# Patient Record
Sex: Female | Born: 1990 | Race: White | Hispanic: No | Marital: Single | Smoking: Never smoker
Health system: Southern US, Community
[De-identification: ages and names within clinical notes are randomized; demographics above are authoritative.]

---

## 2016-08-15 ENCOUNTER — Encounter (INDEPENDENT_AMBULATORY_CARE_PROVIDER_SITE_OTHER): Payer: Self-pay | Admitting: Orthopaedic Surgery

## 2016-08-15 ENCOUNTER — Ambulatory Visit (INDEPENDENT_AMBULATORY_CARE_PROVIDER_SITE_OTHER): Payer: BLUE CROSS/BLUE SHIELD

## 2016-08-15 ENCOUNTER — Ambulatory Visit (INDEPENDENT_AMBULATORY_CARE_PROVIDER_SITE_OTHER): Payer: BLUE CROSS/BLUE SHIELD | Admitting: Orthopaedic Surgery

## 2016-08-15 VITALS — Ht 67.0 in | Wt 120.0 lb

## 2016-08-15 DIAGNOSIS — M545 Low back pain: Secondary | ICD-10-CM

## 2016-08-15 NOTE — Progress Notes (Signed)
Office Visit Note   Patient: Stacey Mills           Date of Birth: 1990/10/04           MRN: [id number] Visit Date: 08/15/2016              Requested by: No referring provider defined for this encounter. PCP: Patient, No Pcp Per   Assessment & Plan: Visit Diagnoses:  1. Low back pain, unspecified back pain laterality, unspecified chronicity, with sciatica presence unspecified     Plan: I can certainly see whether this is frustrating for her given the failure of rest, ice, time, anti-inflammatories, activity modification, chiropractic treatment as well as formal physical therapy. This is not appear to be an exertional compartment syndrome and this may be more lumbar spine related since she says it comes from the waist down. At this point given the chronicity of her situation I would like to obtain an MRI to assess for stenosis or rule out any other issues that can be related to the radicular symptoms. I'll reexamine her to the next visit after the MRI is obtained all questions were encouraged and answered.  Follow-Up Instructions: Return in about 2 weeks (around 08/29/2016).   Orders:  Orders Placed This Encounter  Procedures  . XR Lumbar Spine 2-3 Views  . MR Lumbar Spine w/o contrast   No orders of the defined types were placed in this encounter.     Procedures: No procedures performed   Clinical Data: No additional findings.   Subjective: Chief Complaint  Patient presents with  . Lower Back - Pain  . Right Leg - Pain  Patient is very pleasant athletic and then 26 year old female who is certainly frustrated from over a year's worth of difficult to describe pain and "weird feeling" and involves mainly her right lower extremity. She says things do not feel right from the waist down but this is activity related. Any type of exercise activities such as treadmill running that occur within a short time frame causes pain. She does not describe this toe is a compartment syndrome or  an exertional compartment syndrome type of pain. There is not a specific numbness and tingling. She says it the right foot sometimes slaps against the ground and that her foot in general does not feel right but is hard to describe. It's almost entire type of feeling. This does occur with only high-impact activities. She does get pain in her back was radiate down her legs. She denies any change in bowel or bladder function. She has seen a chiropractor and has multiple encounters with several different physical therapist to no avail. Was become frustrated fact that she cannot exercise the way she would like to.  HPI  Review of Systems She denies any headache, chest pain, short of breath, fever, chills, nausea, vomiting  Objective: Vital Signs: Ht 5\' 7"  (1.702 m)   Wt 120 lb (54.4 kg)   BMI 18.79 kg/m   Physical Exam She is alert and oriented 3 and in no acute distress Ortho Exam She is a thin appearing individual. She has excellent strength in her bilateral lower extremities with normal sensation in all dermatomes. She has normal muscle contours. She has normal perfusion of both feet with palpable pulses. She has full flexion-extension of her lumbar spine but a negative straight leg raise bilaterally. There is no Tinel sign over the superficial peroneal nerve on the right or left side. Specialty Comments:  No specialty comments available.  Imaging: Xr Lumbar Spine 2-3 Views  Result Date: 08/15/2016 An AP and lateral of the lumbar spine show no acute findings. The disc heights and spaces are well maintained. The foramina appear patent. The overall alignment shows normal lordosis on lateral view and a straight on the AP view.    PMFS History: There are no active problems to display for this patient.  No past medical history on file.  No family history on file.  No past surgical history on file. Social History   Occupational History  . Not on file.   Social History Main Topics  .  Smoking status: Never Smoker  . Smokeless tobacco: Never Used  . Alcohol use Yes     Comment: 2x/wk  . Drug use: No  . Sexual activity: Not on file

## 2016-08-26 ENCOUNTER — Ambulatory Visit
Admission: RE | Admit: 2016-08-26 | Discharge: 2016-08-26 | Disposition: A | Discharge status: Home / Self Care | Payer: BLUE CROSS/BLUE SHIELD | Source: Ambulatory Visit | Attending: Orthopaedic Surgery | Admitting: Orthopaedic Surgery

## 2016-08-26 DIAGNOSIS — M545 Low back pain: Secondary | ICD-10-CM

## 2016-08-29 ENCOUNTER — Ambulatory Visit (INDEPENDENT_AMBULATORY_CARE_PROVIDER_SITE_OTHER): Payer: BLUE CROSS/BLUE SHIELD | Admitting: Orthopaedic Surgery

## 2016-08-29 DIAGNOSIS — M79604 Pain in right leg: Secondary | ICD-10-CM | POA: Insufficient documentation

## 2016-08-29 DIAGNOSIS — M79605 Pain in left leg: Secondary | ICD-10-CM | POA: Insufficient documentation

## 2016-08-29 NOTE — Progress Notes (Signed)
The patient is someone who is following up after MRI of her lumbar spine. She's interesting individual minute she is quite athletic at 26 years old. She was first of the knee with an assumption that she may be having exertional compartment syndrome. She though is describing pain in her thighs going down the both feet. She's recently been training for just 5K race is an she's been able to tolerate running on an indoor track for about 3 miles. Again it still does not sound like compartment syndrome. I did send her for an MRI of her lumbar spine to rule out any type of stenosis or other pathologic abnormalities of lumbar spinal that could be causing the symptoms.  Still on exam her bilateral lower extremity exam is normal. The MRI does not show any significant findings to explain the symptoms that she is having.  At this point I would like to send her to Dr. Gaspar [facility] Rigby [facility] to evaluate her from a standpoint of assessing her running and gait and making sure were not missing anything else that could be causing the symptoms she is having down her legs. She is interested in that evaluation as well. We will work on making that referral. All questions were encouraged and answered.

## 2016-08-30 ENCOUNTER — Other Ambulatory Visit (INDEPENDENT_AMBULATORY_CARE_PROVIDER_SITE_OTHER): Payer: Self-pay

## 2016-08-30 DIAGNOSIS — M545 Low back pain, unspecified: Secondary | ICD-10-CM

## 2016-09-11 ENCOUNTER — Encounter: Payer: Self-pay | Admitting: Sports Medicine

## 2016-09-11 ENCOUNTER — Ambulatory Visit (INDEPENDENT_AMBULATORY_CARE_PROVIDER_SITE_OTHER): Payer: BLUE CROSS/BLUE SHIELD | Admitting: Sports Medicine

## 2016-09-11 VITALS — BP 110/80 | HR 69 | Ht 67.0 in | Wt 121.2 lb

## 2016-09-11 DIAGNOSIS — M79604 Pain in right leg: Secondary | ICD-10-CM

## 2016-09-11 DIAGNOSIS — M47816 Spondylosis without myelopathy or radiculopathy, lumbar region: Secondary | ICD-10-CM | POA: Insufficient documentation

## 2016-09-11 DIAGNOSIS — R269 Unspecified abnormalities of gait and mobility: Secondary | ICD-10-CM

## 2016-09-11 NOTE — Assessment & Plan Note (Signed)
She does have some low lumbar facet arthrosis as well as significant denervation atrophy of the paraspinal musculature at the lowest lumbar segments that is not present higher up.  This is consistent with dysfunctional firing pattern and likely represents some of the underlying issues that are occurring with running.  Performing gait analysis of her on the treadmill did reveal a overpronation of the right foot compared to the left as well as a slight valgus shift with weightbearing at the right knee.  She has a rigid right pelvis with foot strike.  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  We will have her obtain over-the-counter cushioned sports insoles as well to run and and will plan to check in with her in 6 weeks to ensure that she has had good improvement in her hip abduction strength and running tolerance.  This does not seem to be consistent with a popliteal artery entrapment syndrome or exertional compartment syndrome given the lack of reproducibility and the fact that there are some functional movement discrepancies issues seen on exam.  If any lack of improvement can consider custom cushion orthotics with [employer].  We will plan to discuss increasing her training plan at next visit if symptoms have resolved.

## 2016-09-11 NOTE — Patient Instructions (Signed)
Also check out the YouTube Video from Dr. Myles LippsEric Goodman.  I would like to see you try performing this 5-6 days per week.    A good intro video is: "Independence from Pain 7-minute Video" - [email or web address]   His more advanced video is: "Powerful Posture and Pain Relief: 12 minutes of Foundation Training" - [email or web address]://[email or web address].be/[email or web address]   Do not try to attempt this entire video when first beginning.    Try breaking of each exercise that he goes into shorter segments.  Otherwise if they perform an exercise for 45 seconds, start with 15 seconds and rest and then resume with a begin the new activity.  Work your way up to doing this 12 minute video and I expect to see significant improvements in your pain.  ++++++++++++++++++++++++++++++++++++++++++++  I recommend that you obtain over-the-counter SOLE  medium cushioned insoles.  These can be found at [employer] - or on-line at [email or web address]  Search for "SOLE Active Medium Shoe Insoles"

## 2016-09-11 NOTE — Progress Notes (Signed)
OFFICE VISIT NOTE Stacey Mills. Stacey Mills [facility] [phone]  Stacey Mills - 26 y.o. female MRN [id number]  Date of birth: 1990/08/15  Visit Date: 09/11/2016  PCP: Patient, No Pcp Per   Referred by: No ref. provider found  Stacey Mills, CMA acting as scribe for Stacey Mills.  SUBJECTIVE:   Chief Complaint  Patient presents with  . New Patient (Initial Visit)    low back, rt leg and rt foot pain   HPI: As below and per problem based documentation when appropriate.  Stacey Mills is a new patient referred by Stacey Mills. She presents today with complaint of low back, rt leg, and rt foot pain/discomfort. She has had some pressure and tingling from the knee down but she says that she wouldn't call it pain. She had MRI L-spine done 08/26/2016, ordered by Stacey Mills. She feels "off" when walking but says that its worse when she runs and does leg workouts at the gym. She has had occasional popping and discomfort in her lower back that comes and goes. She has no trouble with the left leg. She has experienced popping and discomfort in the right hip. She has only noticed occasional tightness in the right knee, but real pain, discomfort or swelling. She has had some discomfort on the anterior aspect of the rt ankle. She hasn't experiences any muscle pain in the thigh or calf that she'Mills consider to be unrelated to working out. She does get "charley horses" in her rt calve often.  She reports that she is very active, she works out 5-6 days per week and 2-3 days per week include cardio. She has tried multiple things including, rest, ice, time, antiinflammatories, activity modification, seeing and chiropractor and formal PT and has gotten no relief from her sx.     Review of Systems  Constitutional: Negative.   HENT: Negative.   Eyes: Negative.   Respiratory: Negative for shortness of breath and wheezing.   Cardiovascular: Negative for chest pain,  palpitations and leg swelling.  Gastrointestinal: Positive for heartburn.  Genitourinary: Negative.   Musculoskeletal: Positive for joint pain and myalgias. Negative for falls.  Skin: Negative.   Neurological: Positive for tingling. Negative for dizziness and headaches.  Endo/Heme/Allergies: Does not bruise/bleed easily.  Psychiatric/Behavioral: Negative.     Otherwise per HPI.  HISTORY & PERTINENT PRIOR DATA:  No specialty comments available. She reports that she has never smoked. She has never used smokeless tobacco. No results for input(s): HGBA1C, LABURIC in the last 8760 hours. Medications & Allergies reviewed per EMR Patient Active Problem List   Diagnosis Date Noted  . Gait disturbance 09/11/2016  . Lumbar spondylosis 09/11/2016  . Pain in left leg 08/29/2016  . Pain in right leg 08/29/2016   No past medical history on file. No family history on file. No past surgical history on file. Social History   Occupational History  . Not on file.   Social History Main Topics  . Smoking status: Never Smoker  . Smokeless tobacco: Never Used  . Alcohol use Yes     Comment: 2x/wk  . Drug use: No  . Sexual activity: Not on file    OBJECTIVE:  VS:  HT:5\' 7"  (170.2 cm)   WT:121 lb 3.2 oz (55 kg)  BMI:19    BP:110/80  HR:69bpm  TEMP: ( )  RESP:98 % EXAM: Findings:  WDWN, NAD, Non-toxic appearing Alert & appropriately interactive Not depressed or anxious appearing No  increased work of breathing. Pupils are equal. EOM intact without nystagmus No clubbing or cyanosis of the extremities appreciated No significant rashes/lesions/ulcerations overlying the examined area. DP & PT pulses 2+/4.  No significant pretibial edema. Sensation intact to light touch in lower extremities.  Lower extremities: Overall well aligned.  She is a slight genu valgus with ambulation but this is mild.  Her lower extremity strength is 5+/5 in all myotomes but she does have 4-5 weakness with  right-sided hip abduction with a tensor fascia lata recruitment pattern.  Gait analysis reveals a rigid right hip with foot strike with a dynamic genu valgus and overpronation of the right foot which is a small only subtle but is persistent.     Mr Lumbar Spine W/o Contrast  Result Date: 08/26/2016 CLINICAL DATA:  Right greater than left low back pain for greater than 1 year. Right leg pain. Right foot numbness and tingling. EXAM: MRI LUMBAR SPINE WITHOUT CONTRAST TECHNIQUE: Multiplanar, multisequence MR imaging of the lumbar spine was performed. No intravenous contrast was administered. COMPARISON:  Lumbar spine radiographs 08/15/2016 FINDINGS: Segmentation:  Standard. Alignment:  Normal. Vertebrae:  No fracture, osseous lesion, or marrow edema. Conus medullaris: Extends to the L1-2 level and appears normal. Paraspinal and other soft tissues: Unremarkable. Disc levels: Disc height and hydration are preserved throughout the lumbar spine. No disc herniation is identified. The lumbar spinal canal is capacious. The neural foramina are widely patent. There is mild left facet arthrosis at L5-S1 with a 1.3 cm synovial cyst posterior and lateral to the facet joint, not in a position to cause neural impingement. IMPRESSION: 1. Mild left L5-S1 facet arthrosis. 2. Normal discs.  No herniation or evidence of neural impingement. Electronically Signed   By: Stacey Mills M.Mills.   On: 08/26/2016 09:41   Xr Lumbar Spine 2-3 Views  Result Date: 08/15/2016 An AP and lateral of the lumbar spine show no acute findings. The disc heights and spaces are well maintained. The foramina appear patent. The overall alignment shows normal lordosis on lateral view and a straight on the AP view.  ASSESSMENT & PLAN:     ICD-10-CM   1. Gait disturbance R26.9   2. Pain of right lower extremity M79.604   3. Lumbar spondylosis M47.816   ================================================================= Lumbar spondylosis She does have  some low lumbar facet arthrosis as well as significant denervation atrophy of the paraspinal musculature at the lowest lumbar segments that is not present higher up.  This is consistent with dysfunctional firing pattern and likely represents some of the underlying issues that are occurring with running.  Performing gait analysis of her on the treadmill did reveal a overpronation of the right foot compared to the left as well as a slight valgus shift with weightbearing at the right knee.  She has a rigid right pelvis with foot strike.  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  We will have her obtain over-the-counter cushioned sports insoles as well to run and and will plan to check in with her in 6 weeks to ensure that she has had good improvement in her hip abduction strength and running tolerance.  This does not seem to be consistent with a popliteal artery entrapment syndrome or exertional compartment syndrome given the lack of reproducibility and the  fact that there are some functional movement discrepancies issues seen on exam.  If any lack of improvement can consider custom cushion orthotics with [employer].  We will plan to discuss increasing her training plan at next visit if symptoms have resolved. ================================================================= Patient Instructions  Also check out the YouTube Video from Dr. Myles LippsEric Mills.  I would like to see you try performing this 5-6 days per week.    A good intro video is: "Independence from Pain 7-minute Video" - [email or web address]   His more advanced video is: "Powerful Posture and Pain Relief: 12 minutes of Foundation Training" - [email or web address]://[email or web address].[email or web address]   Stacey Mills not try to  attempt this entire video when first beginning.    Try breaking of each exercise that he goes into shorter segments.  Otherwise if they perform an exercise for 45 seconds, start with 15 seconds and rest and then resume with a begin the new activity.  Work your way up to doing this 12 minute video and I expect to see significant improvements in your pain.  ++++++++++++++++++++++++++++++++++++++++++++  I recommend that you obtain over-the-counter SOLE  medium cushioned insoles.  These can be found at [employer] - or on-line at [employer]  Search for "SOLE Active Medium Shoe Insoles"  ================================================================= Future Appointments Date Time Provider Department Center  10/23/2016 11:00 AM Stacey Mills, Stacey Lintner Mills, Stacey Mills [facility] None    Follow-up: Return in about 6 weeks (around 10/23/2016).   CMA/ATC served as Neurosurgeonscribe during this visit. History, Physical, and Plan performed by medical provider. Documentation and orders reviewed and attested to.      Stacey BiddingMichael Kenton Fortin, Stacey Mills    Stacey Mills Sports Medicine Physician

## 2016-09-13 ENCOUNTER — Telehealth: Payer: Self-pay | Admitting: Sports Medicine

## 2016-09-13 NOTE — Telephone Encounter (Signed)
Patient called wanting to know if she could have her MRI results faxed to [phone].

## 2016-09-14 NOTE — Telephone Encounter (Signed)
Spoke with patient, she will contact Dr. Vevelyn Royals office regarding MRI.

## 2016-09-14 NOTE — Telephone Encounter (Signed)
MRI was ordered by Dr Magnus Ivan, she will need to contact his office.

## 2016-09-18 ENCOUNTER — Telehealth (INDEPENDENT_AMBULATORY_CARE_PROVIDER_SITE_OTHER): Payer: Self-pay

## 2016-09-18 NOTE — Telephone Encounter (Signed)
Patient is wanting a copy of her MRI report faxed to [phone].  Stated that she left a voicemail.  Cb# is [phone].  Please advise.  Thank you.

## 2016-09-21 ENCOUNTER — Telehealth: Payer: Self-pay | Admitting: Sports Medicine

## 2016-09-21 NOTE — Telephone Encounter (Signed)
Patient called in reference to getting notes from last visit about MRI faxed to [phone]. Please call patient with any questions. OK to leave message.

## 2016-09-21 NOTE — Telephone Encounter (Signed)
Spoke with pt, advised to make fax addressed to ATTN: Mickeal Needy. Confirmed fax number. OV note has been faxed to [phone] per pt request.

## 2016-09-24 NOTE — Telephone Encounter (Signed)
Please resend the OV note to the fax (was not received) and print a hard copy for the patient to pick up at the front office. Call patient once both have been done. Patient awaiting call.

## 2016-09-24 NOTE — Telephone Encounter (Signed)
This was completed on 09/19/2016

## 2016-09-24 NOTE — Telephone Encounter (Signed)
A copy is at the front desk for pick up by patient.

## 2016-09-24 NOTE — Telephone Encounter (Signed)
Spoke with patient and advised that since the fax showed that it went through on our end but wasn't received on her end it may be best to come by the office to pick up a copy of the note. Pt verbalized understanding and will pick up a copy of the note which has been left at the front desk for her.

## 2016-10-23 ENCOUNTER — Ambulatory Visit: Payer: BLUE CROSS/BLUE SHIELD | Admitting: Sports Medicine

## 2017-05-07 ENCOUNTER — Encounter: Payer: Self-pay | Admitting: Sports Medicine

## 2017-05-07 ENCOUNTER — Ambulatory Visit: Payer: BLUE CROSS/BLUE SHIELD | Admitting: Sports Medicine

## 2017-05-07 VITALS — BP 102/76 | HR 71 | Ht 67.0 in | Wt 125.8 lb

## 2017-05-07 DIAGNOSIS — R269 Unspecified abnormalities of gait and mobility: Secondary | ICD-10-CM

## 2017-05-07 DIAGNOSIS — M79604 Pain in right leg: Secondary | ICD-10-CM

## 2017-05-07 DIAGNOSIS — M79A21 Nontraumatic compartment syndrome of right lower extremity: Secondary | ICD-10-CM

## 2017-05-07 NOTE — Patient Instructions (Signed)
Look into having your insurance company cover a set of custom orthotics.  The code is L3030 and there are 2 units.  You can call them  and ask if this is covered.  I am happy to do these for you at any time, you just need to let our front office schedulers know you would like an "orthotic appointment."  Please also make sure you bring athletic shoes with you on the day of your orthotic appointment or whatever shoes you plan to wear your orthotics in most frequently.    I recommend you obtained a compression sleeve to help with your joint problems. There are many options on the market however I recommend obtaining a R calf Body [employer]ix compression sleeve.  You can find information (including how to appropriate measure yourself for sizing) can be found at [email or web address].  Many of these products are health savings account (HSA) eligible.   You can use the compression sleeve at any time throughout the day but is most important to use while being active as well as for 2 hours post-activity.   It is appropriate to ice following activity with the compression sleeve in place.   Please perform the exercise program that we have prepared for you and gone over in detail on a daily basis.  In addition to the handout you were provided you can access your program through: [email or web address]   Your unique program code [email or web address]

## 2017-05-07 NOTE — Progress Notes (Signed)
[place]. [facility] [phone]  Yekaterina Escutia - 27 y.o. female MRN [id number]  Date of birth: Apr 03, 1990  Visit Date: 05/07/2017  PCP: Patient, No Pcp Per   Referred by: No ref. provider found  Scribe for today's visit: Stevenson Clinch, CMA     SUBJECTIVE:  Mickeal Needy is here for Follow-up (R lower leg and foot pain)  09/11/2016: Lequita Halt is a new patient referred by Dr. Magnus Ivan. She presents today with complaint of low back, rt leg, and rt foot pain/discomfort. She has had some pressure and tingling from the knee down but she says that she wouldn't call it pain. She had MRI L-spine done 08/26/2016, ordered by Dr. Magnus Ivan. She feels "off" when walking but says that its worse when she runs and does leg workouts at the gym. She has had occasional popping and discomfort in her lower back that comes and goes. She has no trouble with the left leg. She has experienced popping and discomfort in the right hip. She has only noticed occasional tightness in the right knee, but real pain, discomfort or swelling. She has had some discomfort on the anterior aspect of the rt ankle. She hasn't experiences any muscle pain in the thigh or calf that she'd consider to be unrelated to working out. She does get "charley horses" in her rt calve often.  She reports that she is very active, she works out 5-6 days per week and 2-3 days per week include cardio. She has tried multiple things including, rest, ice, time, antiinflammatories, activity modification, seeing and chiropractor and formal PT and has gotten no relief from her sx.   05/07/2017 Compared to the last office visit, her previously described symptoms are worsening. She has noticed swelling on the lateral aspect of the ankle and n/t in her toes. Swelling is only present after activity. She is no longer having cramping in the cald.  Current symptoms are mild pressure and discomfort & are radiating  to R knee. She has been using ice and compression with minimal relief.   MRI L-spine 08/26/16: IMPRESSION: 1. Mild left L5-S1 facet arthrosis. 2. Normal discs.  No herniation or evidence of neural impingement.  No recent XR of ankle or foot.   ROS Denies night time disturbances. Denies fevers, chills, or night sweats. Denies unexplained weight loss. Denies personal history of cancer. Denies changes in bowel or bladder habits. Denies recent unreported falls. Denies new or worsening dyspnea or wheezing. Denies headaches or dizziness.  Reports numbness, tingling in R LEextremities.  Denies dizziness or presyncopal episodes Reports lower extremity edema    HISTORY & PERTINENT PRIOR DATA:  Prior History reviewed and updated per electronic medical record.  Significant/pertinent history, findings, studies include:  reports that she has never smoked. She has never used smokeless tobacco. No results for input(s): HGBA1C, LABURIC, CREATINE in the last 8760 hours. No specialty comments available. No problems updated.  OBJECTIVE:  VS:  HT:5\' 7"  (170.2 cm)   WT:125 lb 12.8 oz (57.1 kg)  BMI:19.7    BP:102/76  HR:71bpm  TEMP: ( )  RESP:99 %   PHYSICAL EXAM: Constitutional: WDWN, Non-toxic appearing. Psychiatric: Alert & appropriately interactive.  Not depressed or anxious appearing. Respiratory: No increased work of breathing.  Trachea Midline Eyes: Pupils are equal.  EOM intact without nystagmus.  No scleral icterus  Vascular Exam: warm to touch no edema  lower extremity neuro exam: unremarkable normal strength normal sensation normal  reflexes  MSK Exam: She has a slightly tight compartments with direct palpation but no focal palpable defects or significant weakness appreciated without exertional testing.  ASSESSMENT & PLAN:   1. Pain in right leg   2. Gait disturbance   3. Exertional compartment syndrome of lower extremity, right     PLAN: We discussed multiple  options today but ultimately given her ongoing symptoms if persistent pain and activity modification are not beneficial further evaluation with exertional compartment testing will be recommended.  We did discuss specific strengthening as well as custom cushioned insoles and compression with activity.  She will plan to follow-up to have custom insoles fabricated at her convenience.  Therapeutic exercises performed working on strengthening and stretching the fascial components.   Follow-up: Return if symptoms worsen or fail to improve.     Please see additional documentation for Objective, Assessment and Plan sections. Pertinent additional documentation may be included in corresponding procedure notes, imaging studies, problem based documentation and patient instructions. Please see these sections of the encounter for additional information regarding this visit.  CMA/ATC served as Neurosurgeonscribe during this visit. History, Physical, and Plan performed by medical provider. Documentation and orders reviewed and attested to.      Andrena MewsMichael D Violanda Bobeck, DO    [facility]

## 2017-05-07 NOTE — Progress Notes (Signed)
PROCEDURE NOTE: THERAPEUTIC EXERCISES (97110) 15 minutes spent for Therapeutic exercises as below and as referenced in the AVS. This included exercises focusing on stretching, strengthening, with significant focus on eccentric aspects.  Proper technique shown and discussed handout in great detail with ATC. All questions were discussed and answered.   Long term goals include an improvement in range of motion, strength, endurance as well as avoiding reinjury. Frequency of visits is one time as determined during today's  office visit. Frequency of exercises to be performed is as per handout.  EXERCISES REVIEWED:  4 way ankle ice cup massage

## 2017-06-06 ENCOUNTER — Encounter: Payer: Self-pay | Admitting: Sports Medicine

## 2017-06-20 ENCOUNTER — Ambulatory Visit: Payer: BLUE CROSS/BLUE SHIELD | Admitting: Family Medicine

## 2017-06-27 ENCOUNTER — Ambulatory Visit: Payer: BLUE CROSS/BLUE SHIELD | Admitting: Family Medicine

## 2017-06-27 ENCOUNTER — Encounter: Payer: Self-pay | Admitting: Family Medicine

## 2017-06-27 DIAGNOSIS — M79604 Pain in right leg: Secondary | ICD-10-CM

## 2017-06-27 NOTE — Patient Instructions (Signed)
You have either exertional compartment syndrome or a fascial herniation of your anterior compartment of the lower leg. We will go ahead with an MRI of your tibia/fibula to assess further. Depending on these results we may consider compartment testing after exercise on a treadmill.

## 2017-07-04 ENCOUNTER — Encounter: Payer: Self-pay | Admitting: Family Medicine

## 2017-07-04 NOTE — Progress Notes (Signed)
PCP: Patient, No Pcp Per  Subjective:   HPI: Patient is a 27 y.o. female here for right leg pain.  Patient reports for 1 year she's had problems with her anterior right lower leg. She runs about 15-20 miles per week but has had to cut this back recently. She reports her pain is especially worse if she runs indoors where she reports swelling anterior lower leg and feels like after 1 to 2 miles she cannot control her right foot. Slight numbness going into middle three toes with pins-and-needles. She does okay with walking and pain is currently 0 out of 10. She saw other physicians and had an MRI of the lumbar spine which was normal without evidence of radiculopathy or spinal stenosis. She denies any acute trauma or injury. No problems with the left leg. She did some back strengthening videos. She tried wearing a calf sleeve which seemed to make her symptoms worse. No skin changes, redness.  History reviewed. No pertinent past medical history.  Current Outpatient Medications on File Prior to Visit  Medication Sig Dispense Refill  . TRI-SPRINTEC 0.18/0.215/0.25 MG-35 MCG tablet   6   No current facility-administered medications on file prior to visit.     History reviewed. No pertinent surgical history.  Allergies  Allergen Reactions  . Other     Shellfish     Social History   Socioeconomic History  . Marital status: Single    Spouse name: Not on file  . Number of children: Not on file  . Years of education: Not on file  . Highest education level: Not on file  Occupational History  . Not on file  Social Needs  . Financial resource strain: Not on file  . Food insecurity:    Worry: Not on file    Inability: Not on file  . Transportation needs:    Medical: Not on file    Non-medical: Not on file  Tobacco Use  . Smoking status: Never Smoker  . Smokeless tobacco: Never Used  Substance and Sexual Activity  . Alcohol use: Yes    Comment: 2x/wk  . Drug use: No  .  Sexual activity: Not on file  Lifestyle  . Physical activity:    Days per week: Not on file    Minutes per session: Not on file  . Stress: Not on file  Relationships  . Social connections:    Talks on phone: Not on file    Gets together: Not on file    Attends religious service: Not on file    Active member of club or organization: Not on file    Attends meetings of clubs or organizations: Not on file    Relationship status: Not on file  . Intimate partner violence:    Fear of current or ex partner: Not on file    Emotionally abused: Not on file    Physically abused: Not on file    Forced sexual activity: Not on file  Other Topics Concern  . Not on file  Social History Narrative  . Not on file    History reviewed. No pertinent family history.  BP 115/78   Pulse 73   Ht 5\' 7"  (1.702 m)   Wt 124 lb (56.2 kg)   BMI 19.42 kg/m   Review of Systems: See HPI above.     Objective:  Physical Exam:  Gen: NAD, comfortable in exam room  Right knee: No gross deformity, ecchymoses, swelling. No TTP. FROM with 5/5 strength  flexion/extension. Negative ant/post drawers. Negative valgus/varus testing. Negative lachmanns. Negative mcmurrays, apleys, patellar apprehension. NV intact distally except decreased sensation right 1st web space.  Right ankle/lower leg: No gross deformity, swelling, ecchymoses FROM with 5/5 strength all directions. No TTP currently Negative ant drawer and talar tilt.   Negative syndesmotic compression. Thompsons test negative. Negative hop test. Negative fulcrum NV intact distally except decreased sensation right 1st web space.   Assessment & Plan:  1. Right leg pain - Patient is reporting symptoms consistent with either exertional compartment syndrome or muscle/fascial herniation of anterior compartment.  She has not had success to date with home exercises/stretches, imaging of lumbar spine, compression sleeve, supportive shoes, gait training.  She  describes a more focal area of swelling of lower leg with exercise anteriorly though - advised we go ahead with MRI to assess for fascial herniation.  If this is normal we had a long discussion about exercise compartment testing.  In meantime, cross training.  Icing, supportive shoes.  Avoid running.

## 2017-07-04 NOTE — Assessment & Plan Note (Signed)
Patient is reporting symptoms consistent with either exertional compartment syndrome or muscle/fascial herniation of anterior compartment.  She has not had success to date with home exercises/stretches, imaging of lumbar spine, compression sleeve, supportive shoes, gait training.  She describes a more focal area of swelling of lower leg with exercise anteriorly though - advised we go ahead with MRI to assess for fascial herniation.  If this is normal we had a long discussion about exercise compartment testing.  In meantime, cross training.  Icing, supportive shoes.  Avoid running.

## 2017-07-05 NOTE — Addendum Note (Signed)
Addended by: Kathi SimpersWISE, Devine Klingel F on: 07/05/2017 12:24 PM   Modules accepted: Orders

## 2017-07-15 ENCOUNTER — Ambulatory Visit (HOSPITAL_BASED_OUTPATIENT_CLINIC_OR_DEPARTMENT_OTHER)
Admission: RE | Admit: 2017-07-15 | Discharge: 2017-07-15 | Disposition: A | Discharge status: Home / Self Care | Payer: BLUE CROSS/BLUE SHIELD | Source: Ambulatory Visit | Attending: Family Medicine | Admitting: Family Medicine

## 2017-07-15 DIAGNOSIS — M79604 Pain in right leg: Secondary | ICD-10-CM | POA: Diagnosis not present

## 2017-07-18 NOTE — Addendum Note (Signed)
Addended by: Kathi SimpersWISE, Lazaro Isenhower F on: 07/18/2017 09:48 AM   Modules accepted: Orders

## 2017-08-13 ENCOUNTER — Ambulatory Visit
Admission: RE | Admit: 2017-08-13 | Discharge: 2017-08-13 | Disposition: A | Discharge status: Home / Self Care | Payer: BLUE CROSS/BLUE SHIELD | Source: Ambulatory Visit | Attending: Family Medicine | Admitting: Family Medicine

## 2017-08-13 DIAGNOSIS — M79604 Pain in right leg: Secondary | ICD-10-CM

## 2017-08-19 NOTE — Addendum Note (Signed)
Addended by: Kathi SimpersWISE, Damiana Berrian F on: 08/19/2017 10:01 AM   Modules accepted: Orders

## 2017-12-12 ENCOUNTER — Encounter: Payer: Self-pay | Admitting: Family Medicine

## 2017-12-12 ENCOUNTER — Ambulatory Visit: Payer: BLUE CROSS/BLUE SHIELD | Admitting: Family Medicine

## 2017-12-12 VITALS — BP 113/76 | HR 73 | Ht 67.0 in | Wt 125.0 lb

## 2017-12-12 DIAGNOSIS — M79604 Pain in right leg: Secondary | ICD-10-CM

## 2017-12-12 DIAGNOSIS — M25532 Pain in left wrist: Secondary | ICD-10-CM

## 2017-12-12 NOTE — Patient Instructions (Signed)
Focus on strengthening and balance exercises of your ankle. Aleve 2 tabs twice a day with food for pain and inflammation - ideally take for just 7-10 days then as needed.  You have carpal tunnel syndrome. Wear the wrist brace at nighttime and as often as possible during the day Aleve as noted above. A prednisone dose pack is a consideration. Corticosteroid injection is a consideration to help with pain and inflammation If not improving, will consider nerve conduction studies to assess severity. Follow up with me in 6 weeks.

## 2017-12-12 NOTE — Progress Notes (Signed)
PCP: Patient, No Pcp Per  Subjective:   HPI: Patient is a 27 y.o. female here for left wrist/thumb pain, f/u leg pain  5/30: Patient reports for 1 year she's had problems with her anterior right lower leg. She runs about 15-20 miles per week but has had to cut this back recently. She reports her pain is especially worse if she runs indoors where she reports swelling anterior lower leg and feels like after 1 to 2 miles she cannot control her right foot. Slight numbness going into middle three toes with pins-and-needles. She does okay with walking and pain is currently 0 out of 10. She saw other physicians and had an MRI of the lumbar spine which was normal without evidence of radiculopathy or spinal stenosis. She denies any acute trauma or injury. No problems with the left leg. She did some back strengthening videos. She tried wearing a calf sleeve which seemed to make her symptoms worse. No skin changes, redness.  11/14: Patient reports she continues to feel issue with anterior right lower leg. Has been doing therapy but focus on laser treatment here, not so much on strengthening. She also reports off and on over a year she's had pain in left wrist , hand into thumb and index finger. Radiates up her arm with some tingling as well. Tried a standard wrist brace, icing, OTC NSAIDs. Tried modifying poses in yoga. Pain up to 4/10 and can be sharp, worse past week. No skin changes, numbness.  History reviewed. No pertinent past medical history.  Current Outpatient Medications on File Prior to Visit  Medication Sig Dispense Refill  . TRI-SPRINTEC 0.18/0.215/0.25 MG-35 MCG tablet   6   No current facility-administered medications on file prior to visit.     History reviewed. No pertinent surgical history.  Allergies  Allergen Reactions  . Other     Shellfish     Social History   Socioeconomic History  . Marital status: Single    Spouse name: Not on file  . Number of  children: Not on file  . Years of education: Not on file  . Highest education level: Not on file  Occupational History  . Not on file  Social Needs  . Financial resource strain: Not on file  . Food insecurity:    Worry: Not on file    Inability: Not on file  . Transportation needs:    Medical: Not on file    Non-medical: Not on file  Tobacco Use  . Smoking status: Never Smoker  . Smokeless tobacco: Never Used  Substance and Sexual Activity  . Alcohol use: Yes    Comment: 2x/wk  . Drug use: No  . Sexual activity: Not on file  Lifestyle  . Physical activity:    Days per week: Not on file    Minutes per session: Not on file  . Stress: Not on file  Relationships  . Social connections:    Talks on phone: Not on file    Gets together: Not on file    Attends religious service: Not on file    Active member of club or organization: Not on file    Attends meetings of clubs or organizations: Not on file    Relationship status: Not on file  . Intimate partner violence:    Fear of current or ex partner: Not on file    Emotionally abused: Not on file    Physically abused: Not on file    Forced sexual activity: Not  on file  Other Topics Concern  . Not on file  Social History Narrative  . Not on file    History reviewed. No pertinent family history.  BP 113/76   Pulse 73   Ht 5\' 7"  (1.702 m)   Wt 125 lb (56.7 kg)   BMI 19.58 kg/m   Review of Systems: See HPI above.     Objective:  Physical Exam:  Gen: NAD, comfortable in exam room  Left hand/wrist: No deformity, swelling, bruising. FROM with 5/5 strength wrist flexion and extension, finger abduction, extension, thumb opposition No tenderness to palpation 1st CMC, 1st dorsal compartment but mild tenderness at carpal tunnel. Negative tinels, phalens, finkelsteins. NVI distally.  Right hand/wrist: No deformity. FROM with 5/5 strength. No tenderness to palpation. NVI distally.  MSK u/s:  Left median nerve volume  0.06cm2.  No tenosynovitis of 1st dorsal compartment.  Assessment & Plan:  1. Left hand/wrist pain - exam and ultrasound are reassuring.  Consistent with carpal tunnel syndrome but very mild.  She gets some symptoms into her thumb and did not tolerate standard wrist brace - will try thumb spica.  Aleve twice a day with food.  Consider prednisone dose pack, injection if not improving.  F/u in 6 weeks.  2. Right leg pain - 2/2 fascial herniation.  Exertional compartment testing negative.  Not had success with home stretches/exercises, compression sleeve, supportive shoes.  Will try focus on strengthening and balance, advise her therapist to focus on these as well.  F/u in 6 weeks.

## 2018-01-09 ENCOUNTER — Encounter (HOSPITAL_COMMUNITY): Payer: Self-pay

## 2018-01-09 ENCOUNTER — Ambulatory Visit (HOSPITAL_COMMUNITY)
Admission: EM | Admit: 2018-01-09 | Discharge: 2018-01-09 | Disposition: A | Discharge status: Home / Self Care | Payer: BLUE CROSS/BLUE SHIELD | Attending: Family Medicine | Admitting: Family Medicine

## 2018-01-09 DIAGNOSIS — N898 Other specified noninflammatory disorders of vagina: Secondary | ICD-10-CM

## 2018-01-09 DIAGNOSIS — N760 Acute vaginitis: Secondary | ICD-10-CM | POA: Diagnosis not present

## 2018-01-09 DIAGNOSIS — B9689 Other specified bacterial agents as the cause of diseases classified elsewhere: Secondary | ICD-10-CM | POA: Diagnosis not present

## 2018-01-09 MED ORDER — TINIDAZOLE 500 MG PO TABS: 500.0000 mg | ORAL_TABLET | Freq: Two times a day (BID) | ORAL | 0 refills | Status: AC

## 2018-01-09 NOTE — Discharge Instructions (Addendum)
We treating you for bacterial vaginosis today.  Lab results pending.

## 2018-01-09 NOTE — ED Triage Notes (Signed)
Pt present vaginal discharge that started two days ago.

## 2018-01-10 LAB — CERVICOVAGINAL ANCILLARY ONLY
Bacterial vaginitis: NEGATIVE
CANDIDA VAGINITIS: NEGATIVE
CHLAMYDIA, DNA PROBE: NEGATIVE
NEISSERIA GONORRHEA: NEGATIVE
TRICH (WINDOWPATH): NEGATIVE

## 2018-01-11 NOTE — ED Provider Notes (Signed)
[facility]    CSN: [id number] Arrival date & time: 01/09/18  1238     History   Chief Complaint Chief Complaint  Patient presents with  . Vaginal Discharge    HPI Stacey Mills is a 27 y.o. female.   Pt is a 27 year old female that presents with 2 days of vaginal discharge.  The discharge is white/yellow and thin.  She denies any odor.  Patient has a history of recurrent BV.  She was recently seen and treated for BV.  She reports she has been taking her precautions to prevent this. She contacted her PCP and they sent diflucan to the pharmacy.she does not believe that she has a yeast infection. She denies any itching or irritation. Denies any dysuria, hematuria, urinary frequency.  Denies any vaginal bleeding or lesions.   ROS per HPI    Vaginal Discharge    History reviewed. No pertinent past medical history.  Patient Active Problem List   Diagnosis Date Noted  . Gait disturbance 09/11/2016  . Lumbar spondylosis 09/11/2016  . Pain in left leg 08/29/2016  . Pain in right leg 08/29/2016    History reviewed. No pertinent surgical history.  OB History   No obstetric history on file.      Home Medications    Prior to Admission medications   Medication Sig Start Date End Date Taking? Authorizing Provider  tinidazole (TINDAMAX) 500 MG tablet Take 1 tablet (500 mg total) by mouth 2 (two) times daily for 5 days. 01/09/18 01/14/18  Janace Aris, NP  TRI-SPRINTEC 0.18/0.215/0.25 MG-35 MCG tablet  06/17/16   [provider]    Family History History reviewed. No pertinent family history.  Social History Social History   Tobacco Use  . Smoking status: Never Smoker  . Smokeless tobacco: Never Used  Substance Use Topics  . Alcohol use: Yes    Comment: 2x/wk  . Drug use: No     Allergies   Other   Review of Systems Review of Systems  Genitourinary: Positive for vaginal discharge.     Physical Exam Triage Vital Signs ED Triage  Vitals  Enc Vitals Group     BP 01/09/18 1316 124/69     Pulse Rate 01/09/18 1316 76     Resp 01/09/18 1316 16     Temp 01/09/18 1316 98.1 F (36.7 C)     Temp Source 01/09/18 1316 Oral     SpO2 01/09/18 1316 100 %     Weight --      Height --      Head Circumference --      Peak Flow --      Pain Score 01/09/18 1317 0     Pain Loc --      Pain Edu? --      Excl. in GC? --    No data found.  Updated Vital Signs BP 124/69 (BP Location: Right Arm)   Pulse 85   Temp 98.1 F (36.7 C) (Oral)   Resp 16   LMP 12/30/2017   SpO2 100%   Visual Acuity Right Eye Distance:   Left Eye Distance:   Bilateral Distance:    Right Eye Near:   Left Eye Near:    Bilateral Near:     Physical Exam Vitals signs and nursing note reviewed.  Constitutional:      Appearance: Normal appearance.  HENT:     Head: Normocephalic and atraumatic.  Neck:     Musculoskeletal: Normal  range of motion.  Pulmonary:     Effort: Pulmonary effort is normal.  Abdominal:     General: Abdomen is flat.     Palpations: Abdomen is soft.     Tenderness: There is no abdominal tenderness. There is no right CVA tenderness or left CVA tenderness.  Genitourinary:    Comments: Deferred.  Denies any abdominal pain, pelvic pain Musculoskeletal: Normal range of motion.  Skin:    General: Skin is warm and dry.  Neurological:     Mental Status: She is alert.  Psychiatric:        Mood and Affect: Mood normal.      UC Treatments / Results  Labs (all labs ordered are listed, but only abnormal results are displayed) Labs Reviewed  CERVICOVAGINAL ANCILLARY ONLY    EKG None  Radiology No results found.  Procedures Procedures (including critical care time)  Medications Ordered in UC Medications - No data to display  Initial Impression / Assessment and Plan / UC Course  I have reviewed the triage vital signs and the nursing notes.  Pertinent labs & imaging results that were available during my care  of the patient were reviewed by me and considered in my medical decision making (see chart for details).     We will go ahead and treat for bacterial vaginosis based on recurrence and symptoms. Lab results pending Final Clinical Impressions(s) / UC Diagnoses   Final diagnoses:  Vaginal discharge     Discharge Instructions     We treating you for bacterial vaginosis today.  Lab results pending.     ED Prescriptions    Medication Sig Dispense Auth. Provider   tinidazole (TINDAMAX) 500 MG tablet Take 1 tablet (500 mg total) by mouth 2 (two) times daily for 5 days. 10 tablet Janace ArisBast, Kameisha Malicki A, NP     Controlled Substance Prescriptions [place] Controlled Substance Registry consulted? no   Janace ArisBast, Johnasia Liese A, NP 01/11/18 1023

## 2018-01-27 ENCOUNTER — Ambulatory Visit: Payer: BLUE CROSS/BLUE SHIELD | Admitting: Family Medicine

## 2018-07-31 ENCOUNTER — Encounter (HOSPITAL_COMMUNITY): Payer: Self-pay

## 2018-07-31 ENCOUNTER — Other Ambulatory Visit: Payer: Self-pay

## 2018-07-31 ENCOUNTER — Ambulatory Visit (HOSPITAL_COMMUNITY)
Admission: EM | Admit: 2018-07-31 | Discharge: 2018-07-31 | Disposition: A | Discharge status: Home / Self Care | Payer: BC Managed Care – PPO | Attending: Internal Medicine | Admitting: Internal Medicine

## 2018-07-31 DIAGNOSIS — B373 Candidiasis of vulva and vagina: Secondary | ICD-10-CM | POA: Diagnosis not present

## 2018-07-31 DIAGNOSIS — B3731 Acute candidiasis of vulva and vagina: Secondary | ICD-10-CM

## 2018-07-31 LAB — POCT URINALYSIS DIP (DEVICE)
Bilirubin Urine: NEGATIVE
Glucose, UA: NEGATIVE mg/dL
Hgb urine dipstick: NEGATIVE
Ketones, ur: NEGATIVE mg/dL
Leukocytes,Ua: NEGATIVE
Nitrite: NEGATIVE
Protein, ur: NEGATIVE mg/dL
Specific Gravity, Urine: >=1.03 (ref 1.005–1.030)
Urobilinogen, UA: 0.2 mg/dL (ref 0.0–1.0)
pH: 5.5 (ref 5.0–8.0)

## 2018-07-31 MED ORDER — METRONIDAZOLE 500 MG PO TABS: 500.0000 mg | ORAL_TABLET | Freq: Two times a day (BID) | ORAL | 0 refills | Status: AC

## 2018-07-31 MED ORDER — FLUCONAZOLE 200 MG PO TABS: ORAL_TABLET | ORAL | 0 refills | Status: DC

## 2018-07-31 NOTE — ED Triage Notes (Signed)
Pt presents with complaints of dysuria, vaginal discharge and vaginal itching. Reports history of bacterial infection and this feels the same. Denies concern for STD's.

## 2018-07-31 NOTE — ED Provider Notes (Signed)
[facility]    CSN: [id number] Arrival date & time: 07/31/18  [id number]     History   Chief Complaint Chief Complaint  Patient presents with  . Vaginal Discharge  . Appointment    [phone]    HPI Stacey Mills is a 28 y.o. female with a history of recurrent bacterial vaginosis and to urgent care with a few days of dysuria, vaginal discharge and vaginal itching.  Patient says that the symptoms started a few days ago and is gotten progressively worse.  Discharge is whitish and associated with some pruritus.  No known relieving factors.  No flank pain.  No fever or chills.  Patient does not douche.  HPI  History reviewed. No pertinent past medical history.  Patient Active Problem List   Diagnosis Date Noted  . Gait disturbance 09/11/2016  . Lumbar spondylosis 09/11/2016  . Pain in left leg 08/29/2016  . Pain in right leg 08/29/2016    History reviewed. No pertinent surgical history.  OB History   No obstetric history on file.      Home Medications    Prior to Admission medications   Medication Sig Start Date End Date Taking? Authorizing Provider  metroNIDAZOLE (FLAGYL) 500 MG tablet Take 1 tablet (500 mg total) by mouth 2 (two) times daily. 07/31/18   Merrilee JanskyLamptey, Yajayra Feldt O, MD  TRI-SPRINTEC 0.18/0.215/0.25 MG-35 MCG tablet  06/17/16   [provider]    Family History Family History  Problem Relation Age of Onset  . Healthy Mother   . Healthy Father     Social History Social History   Tobacco Use  . Smoking status: Never Smoker  . Smokeless tobacco: Never Used  Substance Use Topics  . Alcohol use: Yes    Comment: 2x/wk  . Drug use: No     Allergies   Other   Review of Systems Review of Systems  Constitutional: Negative.   HENT: Negative.   Respiratory: Negative.   Gastrointestinal: Negative.   Genitourinary: Positive for dysuria, frequency, urgency and vaginal discharge. Negative for vaginal bleeding and vaginal pain.  Musculoskeletal:  Negative.   Skin: Negative.   Neurological: Negative for dizziness, weakness and light-headedness.     Physical Exam Triage Vital Signs ED Triage Vitals  Enc Vitals Group     BP 07/31/18 0957 118/65     Pulse Rate 07/31/18 0957 96     Resp 07/31/18 0957 19     Temp 07/31/18 0957 98.3 F (36.8 C)     Temp src --      SpO2 07/31/18 0957 99 %     Weight --      Height --      Head Circumference --      Peak Flow --      Pain Score 07/31/18 0955 3     Pain Loc --      Pain Edu? --      Excl. in GC? --    No data found.  Updated Vital Signs BP 118/65   Pulse 96   Temp 98.3 F (36.8 C)   Resp 19   LMP 07/16/2018   SpO2 99%   Visual Acuity Right Eye Distance:   Left Eye Distance:   Bilateral Distance:    Right Eye Near:   Left Eye Near:    Bilateral Near:     Physical Exam Constitutional:      Appearance: Normal appearance.  Cardiovascular:     Rate and Rhythm: Normal rate  and regular rhythm.  Pulmonary:     Effort: Pulmonary effort is normal.     Breath sounds: Normal breath sounds.  Abdominal:     General: Bowel sounds are normal. There is no distension.     Palpations: Abdomen is soft. There is no mass.     Tenderness: There is no abdominal tenderness.  Musculoskeletal: Normal range of motion.        General: No swelling, deformity or signs of injury.  Skin:    Capillary Refill: Capillary refill takes less than 2 seconds.  Neurological:     General: No focal deficit present.     Mental Status: She is alert and oriented to person, place, and time.      UC Treatments / Results  Labs (all labs ordered are listed, but only abnormal results are displayed) Labs Reviewed  POCT URINALYSIS DIP (DEVICE)  [place]    EKG   Radiology No results found.  Procedures Procedures (including critical care time)  Medications Ordered in UC Medications - No data to display  Initial Impression / Assessment and Plan / UC Course  I  have reviewed the triage vital signs and the nursing notes.  Pertinent labs & imaging results that were available during my care of the patient were reviewed by me and considered in my medical decision making (see chart for details).     1.  Resumed bacterial vaginosis: Metronidazole 500 mg twice daily for 7 days Probiotics Point-of-care urinalysis is negative for urinary tract infection Previous evaluation for bacterial vaginosis when the patient had similar symptoms was negative for BV.  Patient may need to be evaluated further to ascertain the cause of these recurrent symptoms. Final Clinical Impressions(s) / UC Diagnoses   Final diagnoses:  Yeast vaginitis   Discharge Instructions   None    ED Prescriptions    Medication Sig Dispense Auth. Provider   fluconazole (DIFLUCAN) 200 MG tablet  (Status: Discontinued) Please take 1 tablet orally now and repeat in 72 hours. 2 tablet Macall Mccroskey, Myrene Galas, MD   metroNIDAZOLE (FLAGYL) 500 MG tablet Take 1 tablet (500 mg total) by mouth 2 (two) times daily. 14 tablet Lucina Betty, Myrene Galas, MD     Controlled Substance Prescriptions [place] Controlled Substance Registry consulted? No   Chase Picket, MD 07/31/18 [id number]

## 2018-08-04 LAB — URINE CYTOLOGY ANCILLARY ONLY
Bacterial vaginitis: NEGATIVE
Candida vaginitis: NEGATIVE

## 2018-08-24 IMAGING — MR MR LUMBAR SPINE W/O CM
5 series · 46 of 48 positions shown · non-contrast
Comparison: Lumbar spine radiographs 08/15/2016

CLINICAL DATA: Right greater than left low back pain for greater
than 1 year. Right leg pain. Right foot numbness and tingling.

EXAM:
MRI LUMBAR SPINE WITHOUT CONTRAST
TECHNIQUE: Multiplanar, multisequence MR imaging of the lumbar spine was
performed. No intravenous contrast was administered.

[Series 3: tirm sag · sagittal · 4.0mm · 0.55mm/px · 6 of 13 slices shown]
[im 1/13]
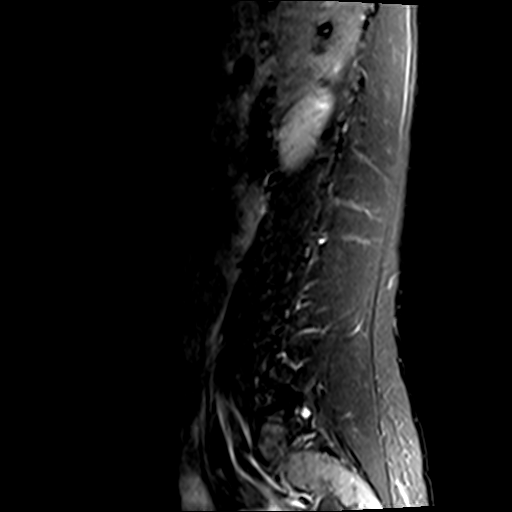
[im 3/13]
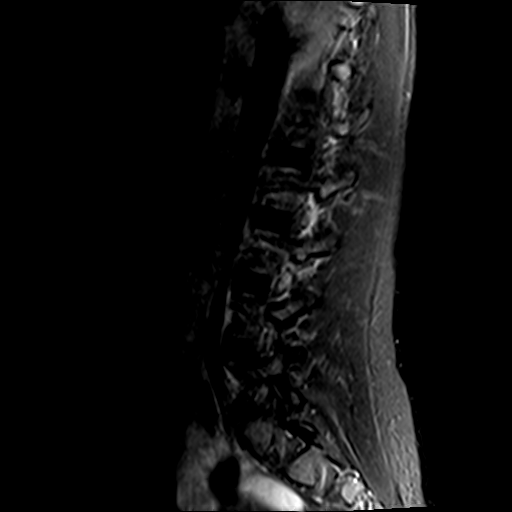
[im 5/13]
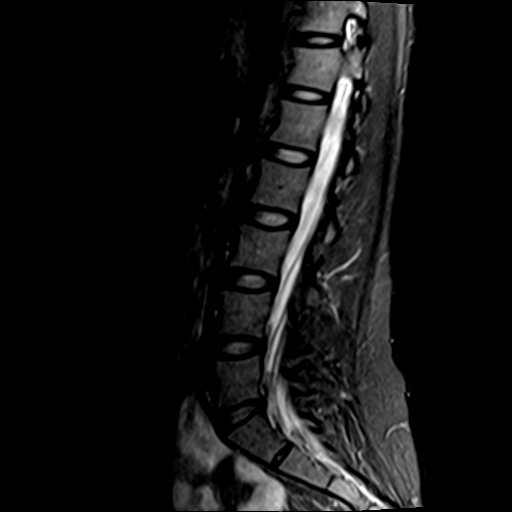
[im 8/13]
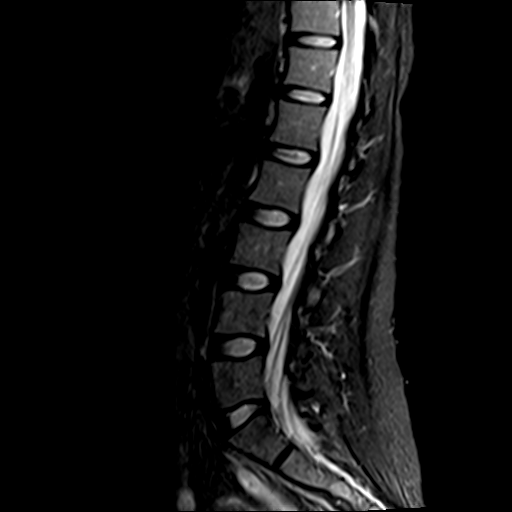
[im 10/13]
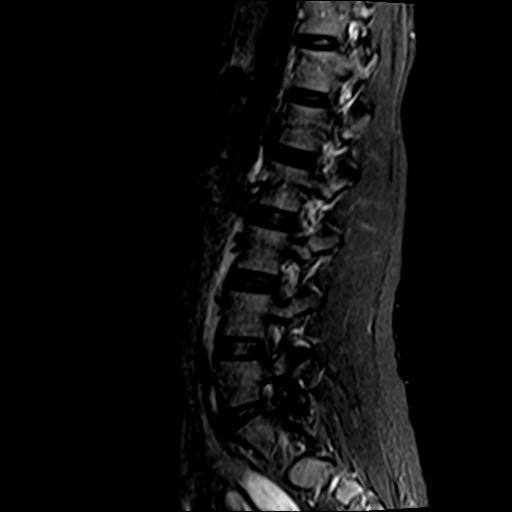
[im 13/13]
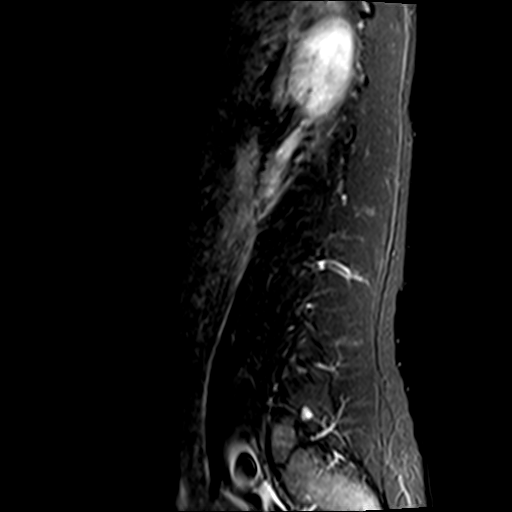

[Series 4: T2 · sagittal · 4.0mm · 0.88mm/px · 5 of 13 slices shown (1 of 2)]
[im 1/13]
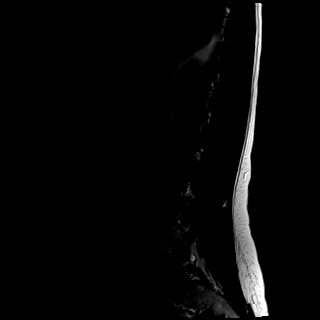
[im 4/13]
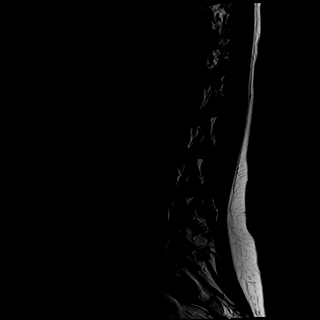
[im 7/13]
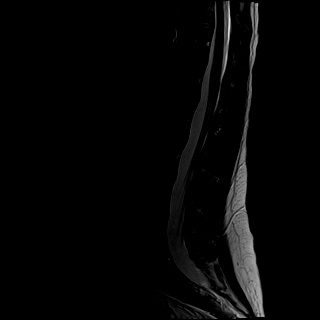
[im 10/13]
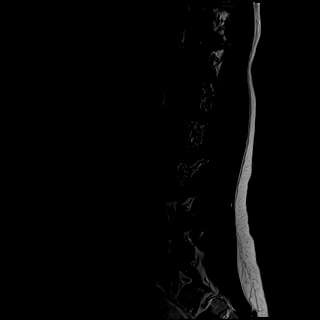
[im 13/13]
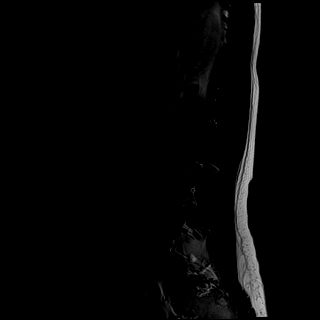

[Series 5: T1 · sagittal · 4.0mm · 0.88mm/px · 5 of 13 slices shown (1 of 2)]
[im 1/13]
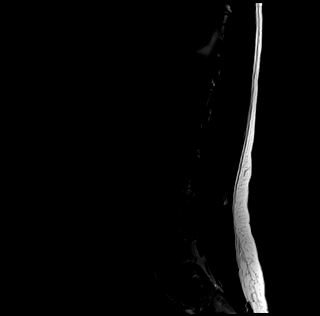
[im 4/13]
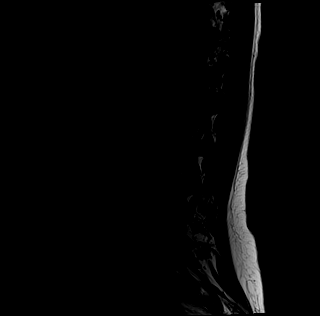
[im 7/13]
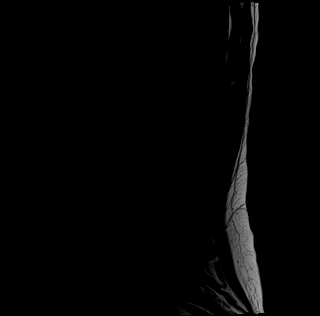
[im 10/13]
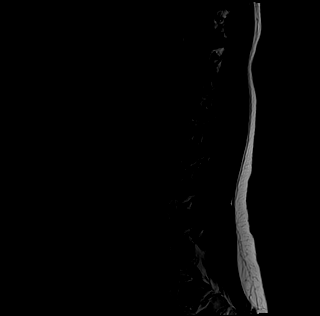
[im 13/13]
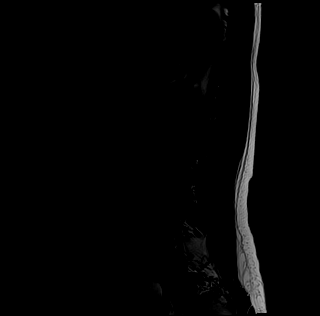

[Series 6: T1 · axial · 4.0mm · 0.78mm/px · z∈[-119,+90]mm · 14 of 40 slices shown (2 of 2)]
[im 1/40]
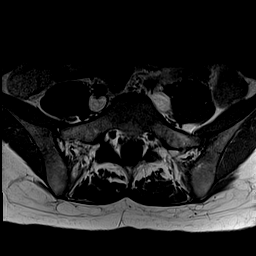
[im 3/40]
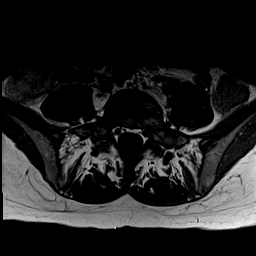
[im 6/40]
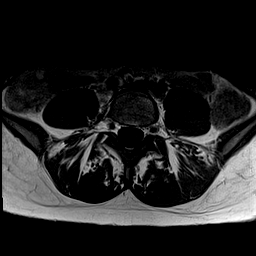
[im 8/40]
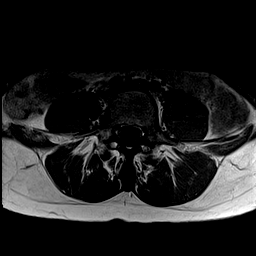
[im 11/40]
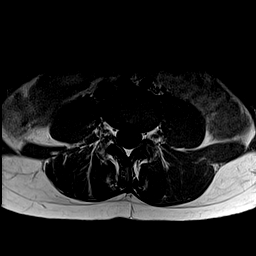
[im 14/40]
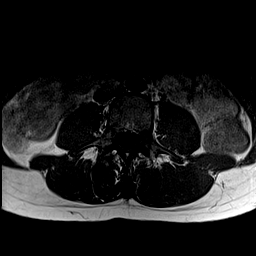
[im 16/40]
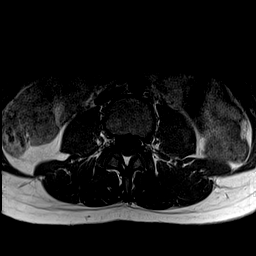
[im 19/40]
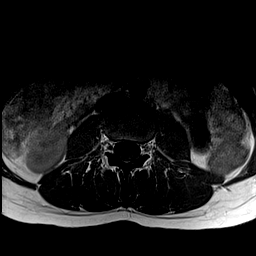
[im 21/40]
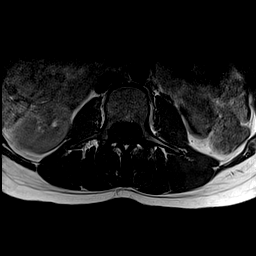
[im 24/40]
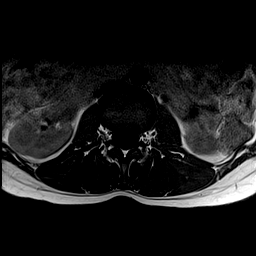
[im 27/40]
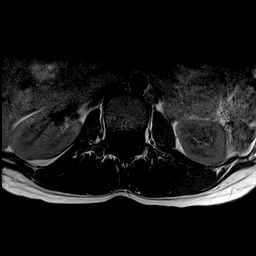
[im 29/40]
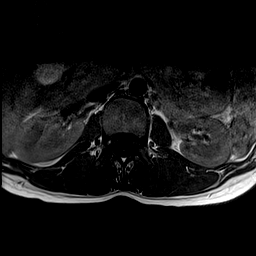
[im 34/40]
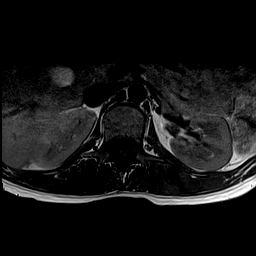
[im 40/40]
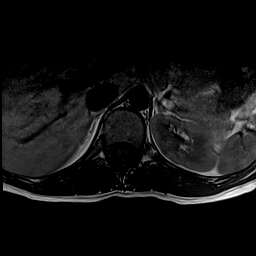

[Series 7: T2 · axial · 4.0mm · 0.78mm/px · z∈[-119,+90]mm · 16 of 40 slices shown (2 of 2)]
[im 1/40]
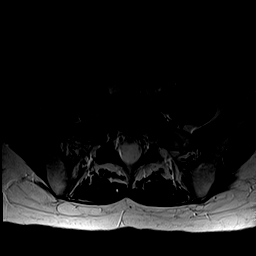
[im 3/40]
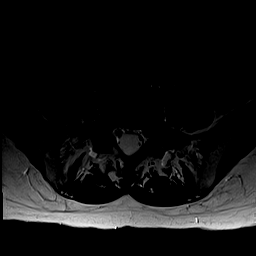
[im 6/40]
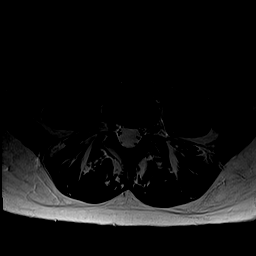
[im 8/40]
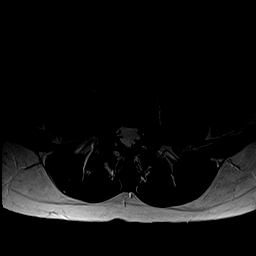
[im 11/40]
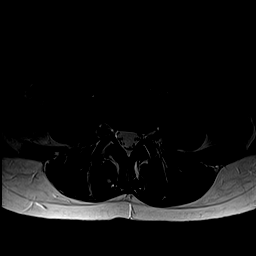
[im 14/40]
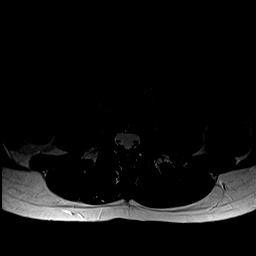
[im 16/40]
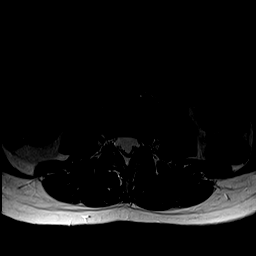
[im 19/40]
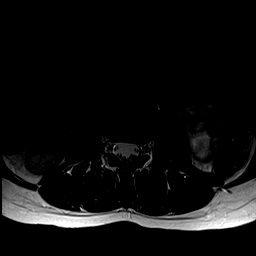
[im 21/40]
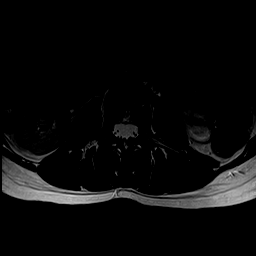
[im 24/40]
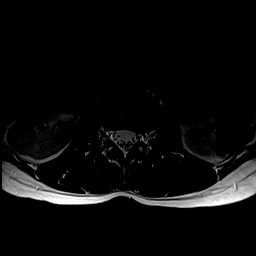
[im 27/40]
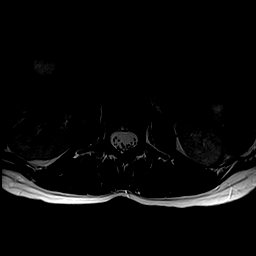
[im 29/40]
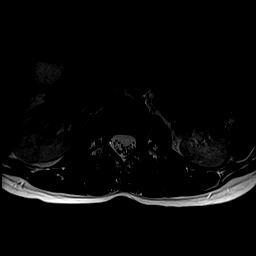
[im 32/40]
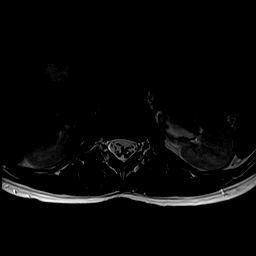
[im 34/40]
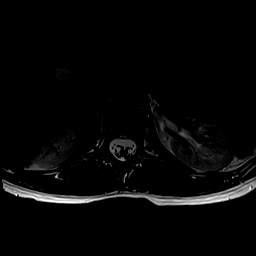
[im 37/40]
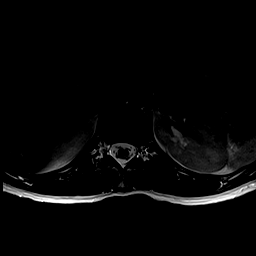
[im 40/40]
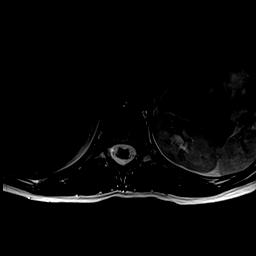

[46 of 48 positions shown; findings below may reference images not displayed]

FINDINGS: Segmentation:  Standard.

Alignment:  Normal.

Vertebrae:  No fracture, osseous lesion, or marrow edema.

Conus medullaris: Extends to the L1-2 level and appears normal.

Paraspinal and other soft tissues: Unremarkable.

Disc levels:

Disc height and hydration are preserved throughout the lumbar spine.
No disc herniation is identified. The lumbar spinal canal is
capacious. The neural foramina are widely patent. There is mild left
facet arthrosis at L5-S1 with a 1.3 cm synovial cyst posterior and
lateral to the facet joint, not in a position to cause neural
impingement.
IMPRESSION: 1. Mild left L5-S1 facet arthrosis.
2. Normal discs.  No herniation or evidence of neural impingement.

## 2018-09-25 ENCOUNTER — Other Ambulatory Visit: Payer: Self-pay | Admitting: Otolaryngology

## 2018-09-25 DIAGNOSIS — J329 Chronic sinusitis, unspecified: Secondary | ICD-10-CM

## 2018-10-03 ENCOUNTER — Ambulatory Visit
Admission: RE | Admit: 2018-10-03 | Discharge: 2018-10-03 | Disposition: A | Discharge status: Home / Self Care | Payer: BC Managed Care – PPO | Source: Ambulatory Visit | Attending: Otolaryngology | Admitting: Otolaryngology

## 2018-10-03 ENCOUNTER — Other Ambulatory Visit: Payer: Self-pay

## 2018-10-03 DIAGNOSIS — J329 Chronic sinusitis, unspecified: Secondary | ICD-10-CM

## 2019-07-13 IMAGING — DX DG TIBIA/FIBULA 2V*R*
4 series · 4 of 4 positions shown · non-contrast
Comparison: None.

CLINICAL DATA: Right lower leg pain, tingling, and swelling for
greater than 1 month. No known trauma.

EXAM:
RIGHT TIBIA AND FIBULA - 2 VIEW

[tibia ap (1 of 2)]
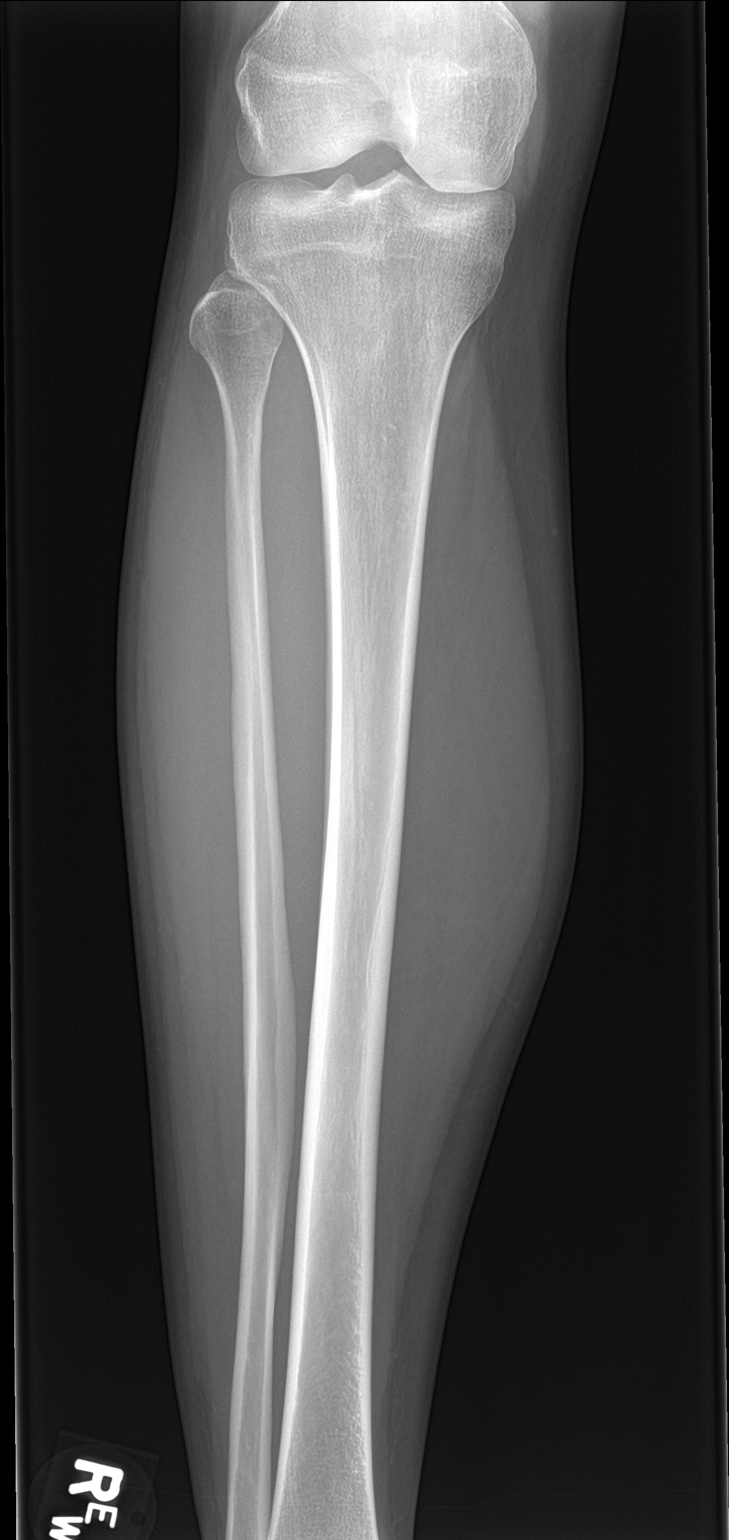

[tibia ap (2 of 2)]
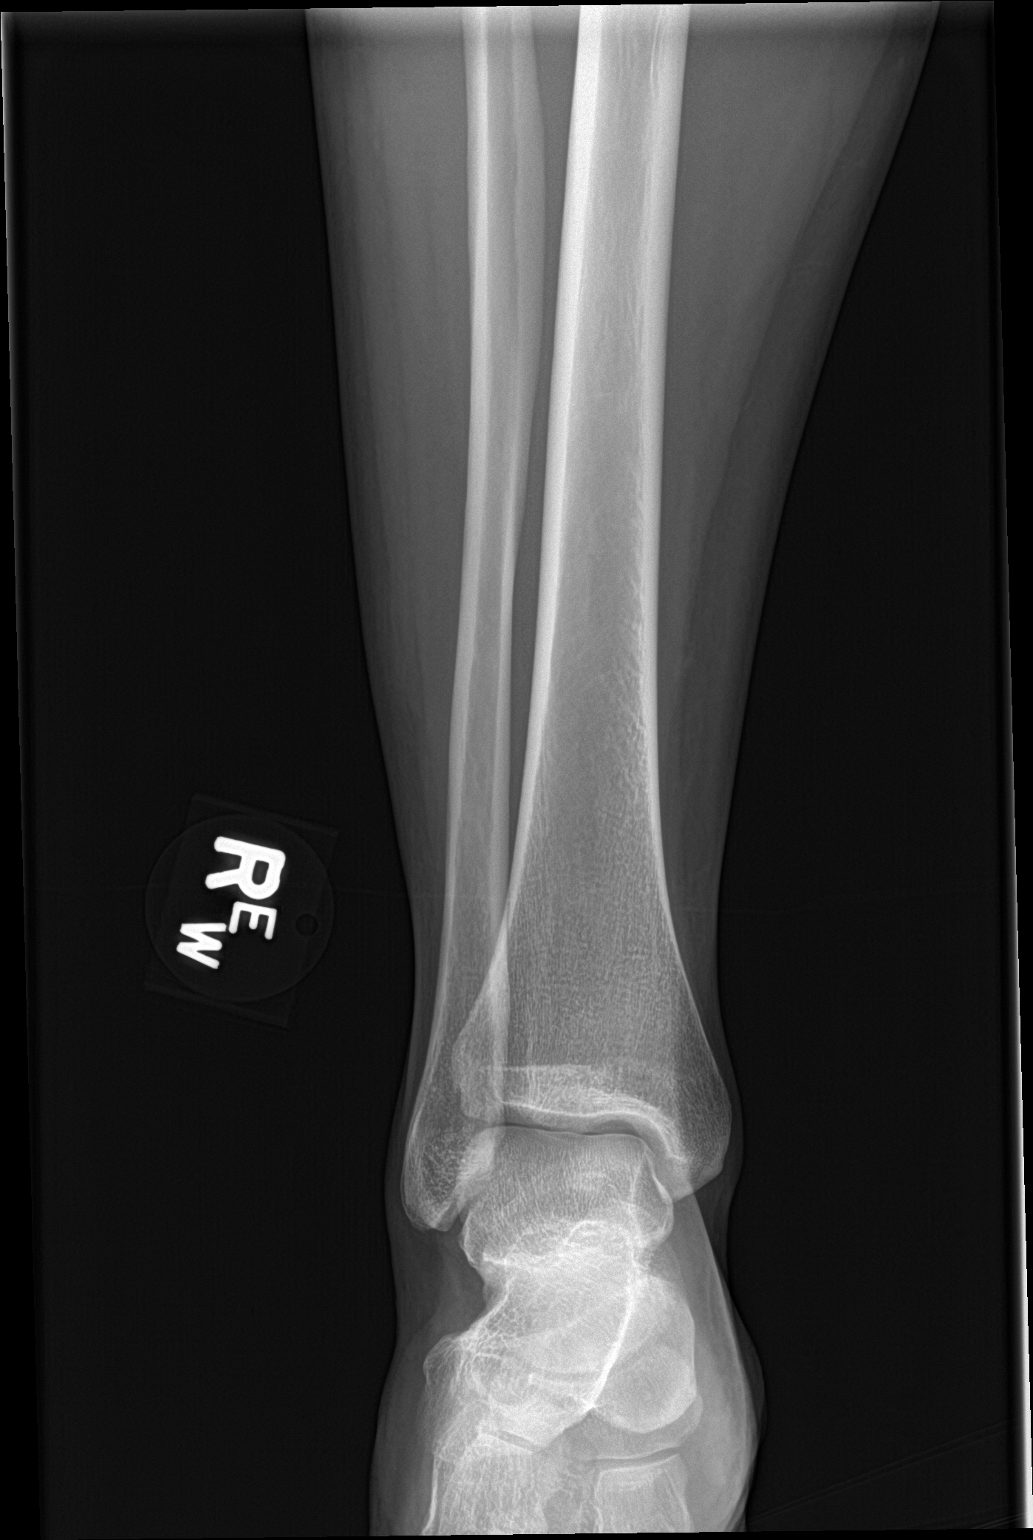

[tibia lat (1 of 2)]
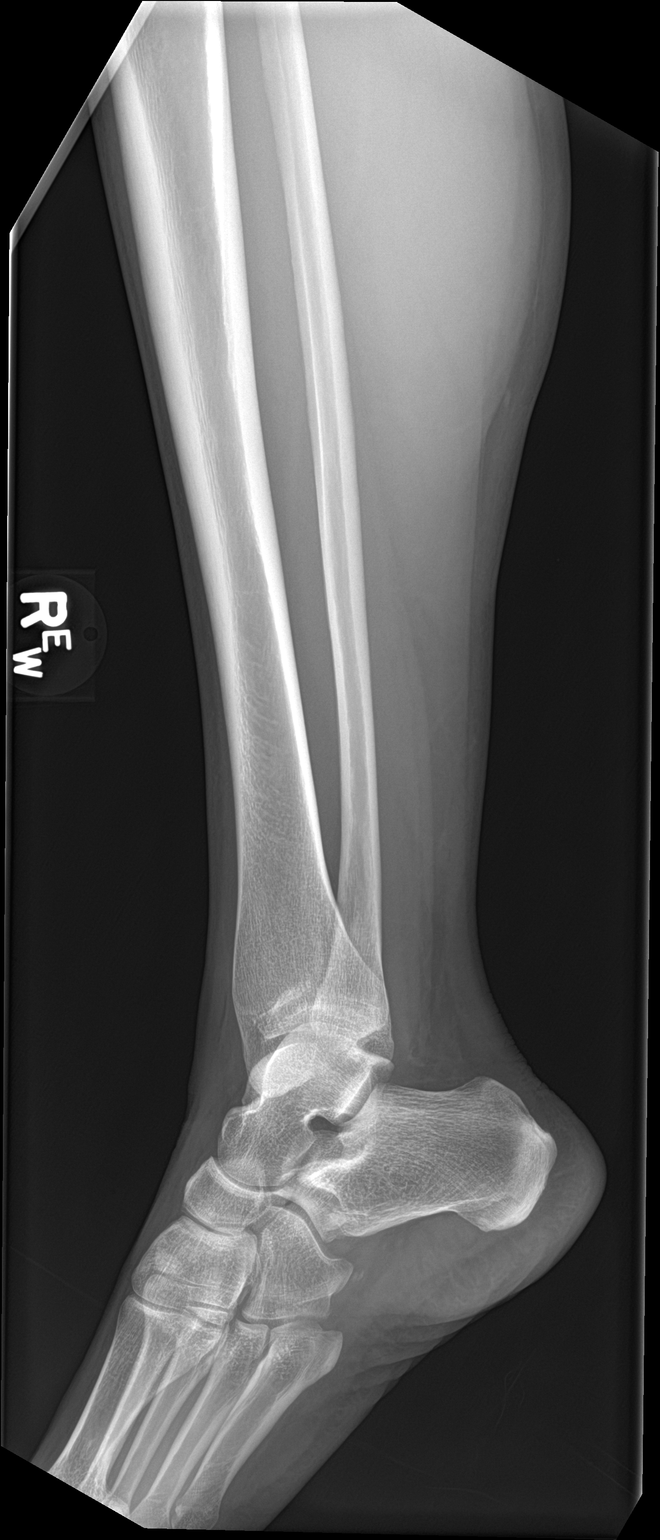

[tibia lat (2 of 2)]
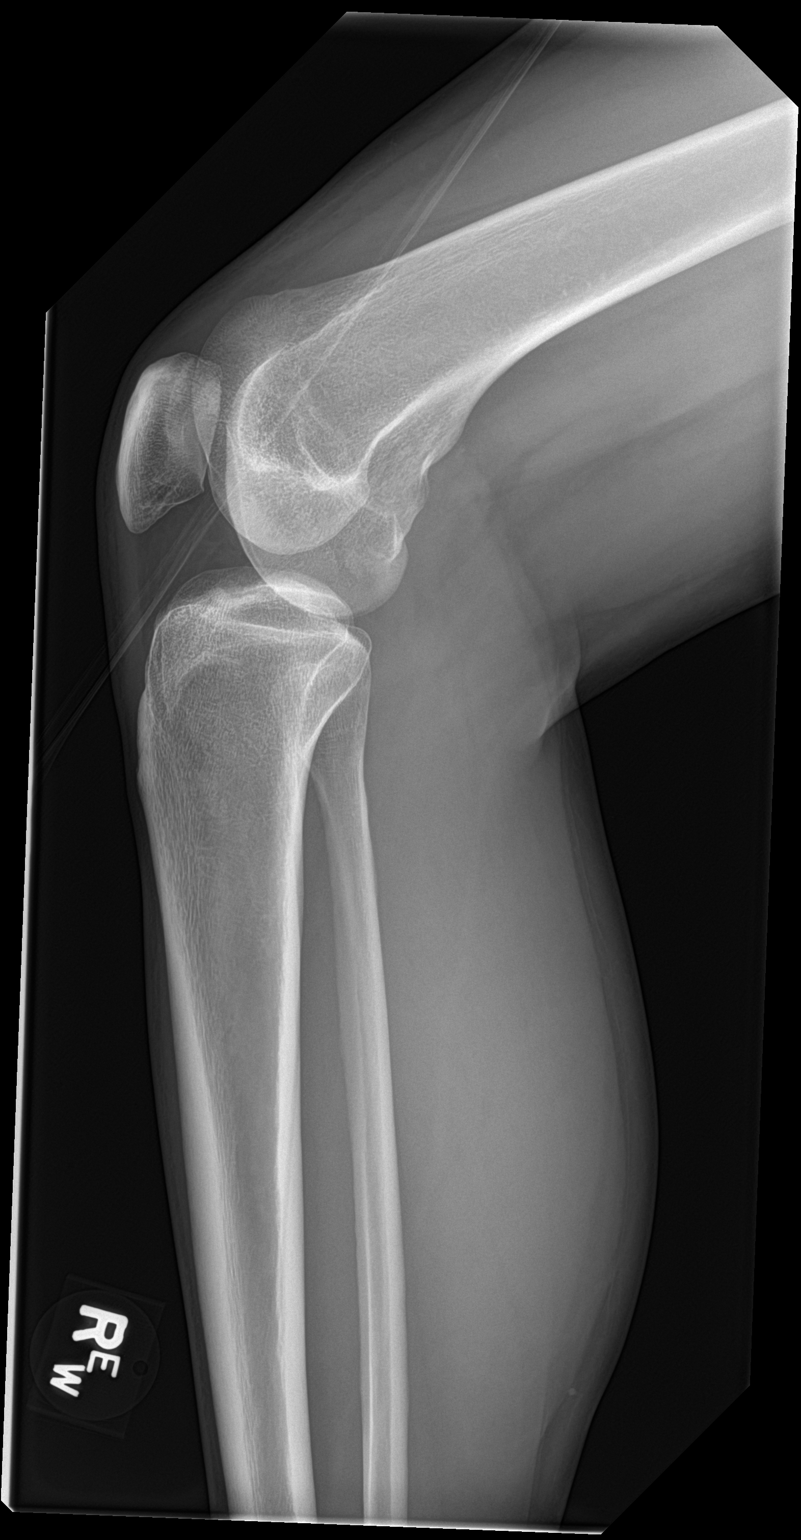

[4 of 4 positions shown; findings below may reference images not displayed]

FINDINGS: There is no evidence of fracture or other focal bone lesions. Soft
tissues are unremarkable.
IMPRESSION: Negative.
